# Patient Record
Sex: Female | Born: 1970 | Race: Black or African American | Hispanic: No | Marital: Single | Smoking: Former smoker
Health system: Southern US, Community
[De-identification: ages and names within clinical notes are randomized; demographics above are authoritative.]

## PROBLEM LIST (undated history)

## (undated) HISTORY — PX: BREAST EXCISIONAL BIOPSY: SUR124

---

## 2016-08-17 ENCOUNTER — Encounter: Payer: Self-pay | Admitting: Emergency Medicine

## 2016-08-17 ENCOUNTER — Emergency Department: Payer: 59

## 2016-08-17 ENCOUNTER — Emergency Department
Admission: EM | Admit: 2016-08-17 | Discharge: 2016-08-17 | Disposition: A | Discharge status: Home / Self Care | Payer: 59 | Attending: Emergency Medicine | Admitting: Emergency Medicine

## 2016-08-17 DIAGNOSIS — J069 Acute upper respiratory infection, unspecified: Secondary | ICD-10-CM | POA: Diagnosis not present

## 2016-08-17 DIAGNOSIS — R05 Cough: Secondary | ICD-10-CM | POA: Diagnosis present

## 2016-08-17 DIAGNOSIS — Z87891 Personal history of nicotine dependence: Secondary | ICD-10-CM | POA: Diagnosis not present

## 2016-08-17 MED ORDER — FLUCONAZOLE 150 MG PO TABS: 150.0000 mg | ORAL_TABLET | Freq: Every day | ORAL | 0 refills | Status: DC

## 2016-08-17 MED ORDER — AZITHROMYCIN 250 MG PO TABS: ORAL_TABLET | ORAL | 0 refills | Status: DC

## 2016-08-17 MED ORDER — GUAIFENESIN-CODEINE 100-10 MG/5ML PO SOLN: 10.0000 mL | ORAL | 0 refills | Status: DC | PRN

## 2016-08-17 NOTE — ED Triage Notes (Signed)
Has had nasal/chest congestion since thanksgiving. C/o cough as well but nothing comes up. No known fevers. Pain with cough. No distress noted.

## 2016-08-17 NOTE — ED Provider Notes (Signed)
[facility] Emergency Department Provider Note   ____________________________________________   First MD Initiated Contact with Patient 08/17/16 [phone]     (approximate)  I have reviewed the triage vital signs and the nursing notes.   HISTORY  Chief Complaint Cough    HPI Kristin Villanueva is a 45 y.o. female presents for evaluation of a cough 10 days. Patient states that she still had Tylenol and ibuprofen over-the-counter no relief with over-the-counter cough drops. Patient states that symptoms started on thinks daily and have progressively worsened 7. Patient reports having fever on and off but none today. Took ibuprofen prior to arrival.   History reviewed. No pertinent past medical history.  There are no active problems to display for this patient.   Past Surgical History:  Procedure Laterality Date  . CESAREAN SECTION      Prior to Admission medications   Medication Sig Start Date End Date Taking? Authorizing Provider  azithromycin (ZITHROMAX Z-PAK) 250 MG tablet Take 2 tablets (500 mg) on  Day 1,  followed by 1 tablet (250 mg) once daily on Days 2 through 5. 08/17/16   Evangeline Dakinharles M Bettye Sitton, PA-C  guaiFENesin-codeine 100-10 MG/5ML syrup Take 10 mLs by mouth every 4 (four) hours as needed for cough. 08/17/16   Evangeline Dakinharles M Birgit Nowling, PA-C    Allergies Patient has no known allergies.  History reviewed. No pertinent family history.  Social History Social History  Substance Use Topics  . Smoking status: Former Games developermoker  . Smokeless tobacco: Never Used  . Alcohol use Yes    Review of Systems Constitutional: No fever/chills Eyes: No visual changes. ENT: No sore throat. Cardiovascular: Denies chest pain. Respiratory: Denies shortness of breath.Positive for productive cough with yellow-green sputum. Gastrointestinal: No abdominal pain.  No nausea, no vomiting.  No diarrhea.  No constipation. Genitourinary: Negative for dysuria. Musculoskeletal: Negative  for back pain. Skin: Negative for rash. Neurological: Negative for headaches, focal weakness or numbness.  10-point ROS otherwise negative.  ____________________________________________   PHYSICAL EXAM:  VITAL SIGNS: ED Triage Vitals  Enc Vitals Group     BP 08/17/16 0921 (!) 117/98     Pulse Rate 08/17/16 0921 75     Resp 08/17/16 0921 18     Temp 08/17/16 0921 98.4 F (36.9 C)     Temp Source 08/17/16 0921 Oral     SpO2 08/17/16 0921 99 %     Weight 08/17/16 0922 160 lb (72.6 kg)     Height 08/17/16 0922 5\' 4"  (1.626 m)     Head Circumference --      Peak Flow --      Pain Score 08/17/16 0922 6     Pain Loc --      Pain Edu? --      Excl. in GC? --     Constitutional: Alert and oriented. Well appearing and in no acute distress. Eyes: Conjunctivae are normal. PERRL. EOMI. Head: Atraumatic. Nose: No congestion/rhinnorhea. Mouth/Throat: Mucous membranes are moist.  Oropharynx non-erythematous. Neck: No stridor. Supple, full range of motion nontender.  Cardiovascular: Normal rate, regular rhythm. Grossly normal heart sounds.  Good peripheral circulation. Respiratory: Normal respiratory effort.  No retractions. Lungs CTAB. No rhonchi or rales noted. No wheezing available. Gastrointestinal: Soft and nontender. No distention. No abdominal bruits. No CVA tenderness. Musculoskeletal: No lower extremity tenderness nor edema.  No joint effusions. Neurologic:  Normal speech and language. No gross focal neurologic deficits are appreciated. No gait instability. Skin:  Skin is warm,  dry and intact. No rash noted. Psychiatric: Mood and affect are normal. Speech and behavior are normal.  ____________________________________________   LABS (all labs ordered are listed, but only abnormal results are displayed)  Labs Reviewed - No data to display ____________________________________________  EKG   ____________________________________________  RADIOLOGY  No acute radiological  findings ____________________________________________   PROCEDURES  Procedure(s) performed: None  Procedures  Critical Care performed: No  ____________________________________________   INITIAL IMPRESSION / ASSESSMENT AND PLAN / ED COURSE  Pertinent labs & imaging results that were available during my care of the patient were reviewed by me and considered in my medical decision making (see chart for details).  Acute bronchitis. We'll obtain a chest x-ray to rule out pneumonia.  Clinical Course    Acute bronchitis/URI. Rx given for Zithromax, Robitussin-AC. Continue ibuprofen and over-the-counter medication as needed. Follow up with PCP or return to ER with any worsening symptomology.  ____________________________________________   FINAL CLINICAL IMPRESSION(S) / ED DIAGNOSES  Final diagnoses:  Upper respiratory tract infection, unspecified type      NEW MEDICATIONS STARTED DURING THIS VISIT:  New Prescriptions   AZITHROMYCIN (ZITHROMAX Z-PAK) 250 MG TABLET    Take 2 tablets (500 mg) on  Day 1,  followed by 1 tablet (250 mg) once daily on Days 2 through 5.   GUAIFENESIN-CODEINE 100-10 MG/5ML SYRUP    Take 10 mLs by mouth every 4 (four) hours as needed for cough.     Note:  This document was prepared using Dragon voice recognition software and may include unintentional dictation errors.   Evangeline Dakinharles M Joselynne Killam, PA-C 08/17/16 1103    Emily FilbertJonathan E Williams, MD 08/17/16 [phone]

## 2017-09-24 ENCOUNTER — Emergency Department
Admission: EM | Admit: 2017-09-24 | Discharge: 2017-09-24 | Disposition: A | Discharge status: Home / Self Care | Payer: Managed Care, Other (non HMO) | Source: Home / Self Care | Attending: Emergency Medicine | Admitting: Emergency Medicine

## 2017-09-24 ENCOUNTER — Other Ambulatory Visit: Payer: Self-pay

## 2017-09-24 ENCOUNTER — Encounter: Payer: Self-pay | Admitting: Emergency Medicine

## 2017-09-24 ENCOUNTER — Emergency Department
Admission: EM | Admit: 2017-09-24 | Discharge: 2017-09-24 | Disposition: A | Discharge status: Home / Self Care | Payer: Managed Care, Other (non HMO) | Attending: Student in an Organized Health Care Education/Training Program | Admitting: Student in an Organized Health Care Education/Training Program

## 2017-09-24 DIAGNOSIS — K047 Periapical abscess without sinus: Secondary | ICD-10-CM

## 2017-09-24 DIAGNOSIS — Z87891 Personal history of nicotine dependence: Secondary | ICD-10-CM

## 2017-09-24 DIAGNOSIS — K0889 Other specified disorders of teeth and supporting structures: Secondary | ICD-10-CM | POA: Diagnosis present

## 2017-09-24 MED ORDER — HYDROCODONE-ACETAMINOPHEN 5-325 MG PO TABS: 1.0000 | ORAL_TABLET | Freq: Four times a day (QID) | ORAL | 0 refills | Status: DC | PRN

## 2017-09-24 MED ORDER — AMOXICILLIN 875 MG PO TABS: 875.0000 mg | ORAL_TABLET | Freq: Two times a day (BID) | ORAL | 0 refills | Status: DC

## 2017-09-24 MED ORDER — AMOXICILLIN 500 MG PO CAPS
500.0000 mg | ORAL_CAPSULE | Freq: Once | ORAL | Status: AC
  Administered 2017-09-24: 500 mg via ORAL
  Filled 2017-09-24: qty 1

## 2017-09-24 MED ORDER — ACETAMINOPHEN 325 MG PO TABS
650.0000 mg | ORAL_TABLET | Freq: Once | ORAL | Status: AC
  Administered 2017-09-24: 650 mg via ORAL
  Filled 2017-09-24: qty 2

## 2017-09-24 MED ORDER — BUPIVACAINE HCL (PF) 0.5 % IJ SOLN
20.0000 mL | Freq: Once | INTRAMUSCULAR | Status: AC
  Administered 2017-09-24: 20 mL
  Filled 2017-09-24: qty 30

## 2017-09-24 NOTE — Discharge Instructions (Signed)
Please seek medical attention for any high fevers, chest pain, shortness of breath, change in behavior, persistent vomiting, bloody stool or any other new or concerning symptoms.  

## 2017-09-24 NOTE — Discharge Instructions (Signed)
Follow-up with your regular doctor if you are not better and 3-5 days, you can either call the [facility] or Dr. Renae GlossHogan's office, both phone numbers have been given to you, take medication as prescribed, also take ibuprofen, return to emergency department if you are worsening

## 2017-09-24 NOTE — ED Triage Notes (Signed)
Pt arrived to the ED for complaints of left lower dental pain. Pt is AOx4 in no apparent distress with notable swelling and redness on the affected area.

## 2017-09-24 NOTE — ED Provider Notes (Signed)
[facility] Emergency Department Provider Note  ____________________________________________   I have reviewed the triage vital signs and the nursing notes.   HISTORY  Chief Complaint Dental Abscess   History limited by: Not Limited   HPI Kristin Villanueva is a 47 y.o. female who presents to the emergency department today because of concern for left facial swelling and pain.   LOCATION:left lower jaw DURATION:1 day TIMING: constant, worsening SEVERITY: severe QUALITY: painful, swelling CONTEXT: patient was seen in the emergency department earlier today because of concern for left lower jaw pain. Diagnosed with dental infection at that time. States she was put on antibiotics. Today the pain continued and swelling had developed.  MODIFYING FACTORS: worse with palpation ASSOCIATED SYMPTOMS: denies any fevers. Has had trouble with swallowing.   Per medical record review patient has a history of ED visit earlier today.  History reviewed. No pertinent past medical history.  There are no active problems to display for this patient.   Past Surgical History:  Procedure Laterality Date  . CESAREAN SECTION      Prior to Admission medications   Medication Sig Start Date End Date Taking? Authorizing Provider  amoxicillin (AMOXIL) 875 MG tablet Take 1 tablet (875 mg total) by mouth 2 (two) times daily. 09/24/17   Fisher, Roselyn BeringSusan W, PA-C  HYDROcodone-acetaminophen (NORCO/VICODIN) 5-325 MG tablet Take 1 tablet by mouth every 6 (six) hours as needed for moderate pain. 09/24/17   Faythe GheeFisher, Susan W, PA-C    Allergies Patient has no known allergies.  No family history on file.  Social History Social History   Tobacco Use  . Smoking status: Former Games developermoker  . Smokeless tobacco: Never Used  Substance Use Topics  . Alcohol use: Yes  . Drug use: No    Review of Systems Constitutional: No fever/chills Eyes: No visual changes. ENT: Positive for left lower jaw pain and  swelling. Cardiovascular: Denies chest pain. Respiratory: Denies shortness of breath. Gastrointestinal: No abdominal pain.  No nausea, no vomiting.  No diarrhea.   Genitourinary: Negative for dysuria. Musculoskeletal: Negative for back pain. Skin: Negative for rash. Neurological: Negative for headaches, focal weakness or numbness.  ____________________________________________   PHYSICAL EXAM:  VITAL SIGNS: ED Triage Vitals  Enc Vitals Group     BP 09/24/17 1717 (!) 160/92     Pulse Rate 09/24/17 1717 76     Resp 09/24/17 1717 18     Temp 09/24/17 1717 99.8 F (37.7 C)     Temp Source 09/24/17 1717 Oral     SpO2 09/24/17 1717 98 %     Weight 09/24/17 1718 165 lb (74.8 kg)     Height 09/24/17 1718 5\' 4"  (1.626 m)     Head Circumference --      Peak Flow --      Pain Score 09/24/17 1717 10    Constitutional: Alert and oriented. Well appearing and in no distress. Eyes: Conjunctivae are normal.  ENT   Head: Normocephalic and atraumatic.   Nose: No congestion/rhinnorhea.   Mouth/Throat: Poor dentition of the left lower jaw. Tender to palpation. Some bleeding at site of decaying tooth. Swelling to cheek around the area.    Neck: No stridor. Hematological/Lymphatic/Immunilogical: No cervical lymphadenopathy. Cardiovascular: Normal rate, regular rhythm.  No murmurs, rubs, or gallops.  Respiratory: Normal respiratory effort without tachypnea nor retractions. Breath sounds are clear and equal bilaterally. No wheezes/rales/rhonchi. Gastrointestinal: Soft and non tender. No rebound. No guarding.  Genitourinary: Deferred Musculoskeletal: Normal range of motion  in all extremities. No lower extremity edema. Neurologic:  Normal speech and language. No gross focal neurologic deficits are appreciated.  Skin:  Skin is warm, dry and intact. No rash noted. Psychiatric: Mood and affect are normal. Speech and behavior are normal. Patient exhibits appropriate insight and  judgment.  ____________________________________________    LABS (pertinent positives/negatives)  None  ____________________________________________   EKG  None  ____________________________________________    RADIOLOGY  None   ____________________________________________   PROCEDURES  Procedures  NERVE BLOCK Performed by: Phineas Semen Consent: Verbal consent obtained.  Indication: dental pain Nerve block body site: left lower periodontal block  Needle gauge: 25G Location technique: anatomical landmarks  Local anesthetic: 0.5 bupivicaine  Anesthetic total: 2 ml  Outcome: pain improved Patient tolerance: Patient tolerated the procedure well with no immediate complications.   ____________________________________________   INITIAL IMPRESSION / ASSESSMENT AND PLAN / ED COURSE  Pertinent labs & imaging results that were available during my care of the patient were reviewed by me and considered in my medical decision making (see chart for details).  Patient presents with worsening pain and swelling to left lower jaw. Exam is consistent with dental infection/abscess. Did perform dental block which did help alleviate the patient's pain. Discussed with patient importance of continuing antibiotics and follow up with dentist. No airway compromise to suggest deeper abscess.   ____________________________________________   FINAL CLINICAL IMPRESSION(S) / ED DIAGNOSES  Final diagnoses:  Dental abscess     Note: This dictation was prepared with Dragon dictation. Any transcriptional errors that result from this process are unintentional     Phineas Semen, MD 09/24/17 2047

## 2017-09-24 NOTE — ED Notes (Signed)
See triage note  States she thinks she may have an abscess tooth  Increased pain to left lower gum line   Developed swelling to left jaw last pm

## 2017-09-24 NOTE — ED Notes (Signed)
Pt was seen this am in [facility] for dental abscess to left lower jaw - she was given atb and states that the swelling has increased today - c/o difficulty swallowing "on that side"

## 2017-09-24 NOTE — ED Triage Notes (Addendum)
Pt reports seen here this morning for dental abscess. Pt reports swelling and pain have increased. Pt presents with noticeable swelling to lower right jaw. Pt reports received a dose of antibiotics here this morning and took prescribed one this afternoon.

## 2017-09-24 NOTE — ED Notes (Signed)
Dr. Goodman at bedside.  

## 2017-09-24 NOTE — ED Provider Notes (Signed)
[facility] Emergency Department Provider Note  ____________________________________________   First MD Initiated Contact with Patient 09/24/17 0719     (approximate)  I have reviewed the triage vital signs and the nursing notes.   HISTORY  Chief Complaint Dental Pain    HPI Kristin Villanueva is a 47 y.o. female complains of left-sided jaw pain, states she has a bad tooth in the jaws become more swollen overnight, she states is very painful, she has a fear of dentist, she denies fever chills, chest pain or shortness of breath   History reviewed. No pertinent past medical history.  There are no active problems to display for this patient.   Past Surgical History:  Procedure Laterality Date  . CESAREAN SECTION      Prior to Admission medications   Medication Sig Start Date End Date Taking? Authorizing Provider  amoxicillin (AMOXIL) 875 MG tablet Take 1 tablet (875 mg total) by mouth 2 (two) times daily. 09/24/17   Shakil Dirk, Roselyn Bering, PA-C  HYDROcodone-acetaminophen (NORCO/VICODIN) 5-325 MG tablet Take 1 tablet by mouth every 6 (six) hours as needed for moderate pain. 09/24/17   Faythe Ghee, PA-C    Allergies Patient has no known allergies.  History reviewed. No pertinent family history.  Social History Social History   Tobacco Use  . Smoking status: Former Games developer  . Smokeless tobacco: Never Used  Substance Use Topics  . Alcohol use: Yes  . Drug use: No    Review of Systems  Constitutional: No fever/chills Eyes: No visual changes. ENT: No sore throat.  Positive for tooth pain and jaw swelling Respiratory: Denies cough Genitourinary: Negative for dysuria. Musculoskeletal: Negative for back pain. Skin: Negative for rash.    ____________________________________________   PHYSICAL EXAM:  VITAL SIGNS: ED Triage Vitals [09/24/17 0608]  Enc Vitals Group     BP (!) 154/89     Pulse Rate 84     Resp 16     Temp 98.5 F (36.9 C)   Temp Source Oral     SpO2 100 %     Weight 165 lb (74.8 kg)     Height 5\' 4"  (1.626 m)     Head Circumference      Peak Flow      Pain Score 9     Pain Loc      Pain Edu?      Excl. in GC?     Constitutional: Alert and oriented. Well appearing and in no acute distress. Eyes: Conjunctivae are normal.  Head: Atraumatic. Nose: No congestion/rhinnorhea. Mouth/Throat: Mucous membranes are moist.  Left lower molar is grossly decayed, the gum is swollen, the left jaw is swollen and tender, there is no cervical lymphadenopathy noted Cardiovascular: Normal rate, regular rhythm.  Heart sounds are normal Respiratory: Normal respiratory effort.  No retractions, lungs clear to auscultation GU: deferred Musculoskeletal: FROM all extremities, warm and well perfused Neurologic:  Normal speech and language.  Skin:  Skin is warm, dry and intact. No rash noted. Psychiatric: Mood and affect are normal. Speech and behavior are normal.  ____________________________________________   LABS (all labs ordered are listed, but only abnormal results are displayed)  Labs Reviewed - No data to display ____________________________________________   ____________________________________________  RADIOLOGY    ____________________________________________   PROCEDURES  Procedure(s) performed: No      ____________________________________________   INITIAL IMPRESSION / ASSESSMENT AND PLAN / ED COURSE  Pertinent labs & imaging results that were available during my care of  the patient were reviewed by me and considered in my medical decision making (see chart for details).  Patient is 47 year old female complaining o20f left-sided jaw pain due to a dental abscess, physical exam shows swelling to the left jaw and in grossly decayed left lower molar  Diagnosis is dental abscess, prescription for amoxicillin 875 twice daily for 10 days, Norco 5/325 #10 no refill, and she is to continue taking  ibuprofen, she was given phone numbers for dental clinics, she is to follow-up with 1 of them, she is to return to the emergency department if she is worsening     As part of my medical decision making, I reviewed the following data within the electronic MEDICAL RECORD NUMBERCorey is  ____________________________________________   FINAL CLINICAL IMPRESSION(S) / ED DIAGNOSES  Final diagnoses:  Dental abscess      NEW MEDICATIONS STARTED DURING THIS VISIT:  This SmartLink is deprecated. Use AVSMEDLIST instead to display the medication list for a patient.   Note:  This document was prepared using Dragon voice recognition software and may include unintentional dictation errors.    Faythe GheeFisher, Hikeem Andersson W, PA-C 09/24/17 0749    Willy Eddyobinson, Patrick, MD 09/24/17 [phone]

## 2018-03-16 ENCOUNTER — Emergency Department
Admission: EM | Admit: 2018-03-16 | Discharge: 2018-03-16 | Disposition: A | Discharge status: Home / Self Care | Payer: Managed Care, Other (non HMO) | Attending: Emergency Medicine | Admitting: Emergency Medicine

## 2018-03-16 ENCOUNTER — Other Ambulatory Visit: Payer: Self-pay

## 2018-03-16 DIAGNOSIS — Z87891 Personal history of nicotine dependence: Secondary | ICD-10-CM | POA: Diagnosis not present

## 2018-03-16 DIAGNOSIS — K047 Periapical abscess without sinus: Secondary | ICD-10-CM

## 2018-03-16 DIAGNOSIS — K0889 Other specified disorders of teeth and supporting structures: Secondary | ICD-10-CM | POA: Diagnosis present

## 2018-03-16 MED ORDER — LIDOCAINE VISCOUS HCL 2 % MT SOLN: 10.0000 mL | OROMUCOSAL | 0 refills | Status: DC | PRN

## 2018-03-16 MED ORDER — IBUPROFEN 600 MG PO TABS: 600.0000 mg | ORAL_TABLET | Freq: Four times a day (QID) | ORAL | 0 refills | Status: DC | PRN

## 2018-03-16 MED ORDER — AMOXICILLIN 500 MG PO CAPS: 500.0000 mg | ORAL_CAPSULE | Freq: Three times a day (TID) | ORAL | 0 refills | Status: DC

## 2018-03-16 NOTE — ED Provider Notes (Signed)
[facility] Emergency Department Provider Note  ____________________________________________  Time seen: Approximately 7:37 AM  I have reviewed the triage vital signs and the nursing notes.   HISTORY  Chief Complaint Dental Pain and Facial Swelling    HPI Kristin Villanueva is a 47 y.o. female that presents to the emergency department for evaluation of left upper dental pain for 2 days.  Patient  left cheek swelling this morning.  She has not seen a dentist in over a year.  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  She notes that she has many cavities that need to be fixed.  She took 600 mg of ibuprofen this morning with minimal relief.  No fever, chills, difficulty opening or closing mouth, difficulty swallowing, nausea, vomiting.   History reviewed. No pertinent past medical history.  There are no active problems to display for this patient.   Past Surgical History:  Procedure Laterality Date  . CESAREAN SECTION      Prior to Admission medications   Medication Sig Start Date End Date Taking? Authorizing Provider  amoxicillin (AMOXIL) 500 MG capsule Take 1 capsule (500 mg total) by mouth 3 (three) times daily. 03/16/18   Enid Derry, PA-C  HYDROcodone-acetaminophen (NORCO/VICODIN) 5-325 MG tablet Take 1 tablet by mouth every 6 (six) hours as needed for moderate pain. 09/24/17   Fisher, Roselyn Bering, PA-C  ibuprofen (ADVIL,MOTRIN) 600 MG tablet Take 1 tablet (600 mg total) by mouth every 6 (six) hours as needed. 03/16/18   Enid Derry, PA-C  lidocaine (XYLOCAINE) 2 % solution Use as directed 10 mLs in the mouth or throat as needed for mouth pain. 03/16/18   Enid Derry, PA-C    Allergies Patient has no known allergies.  No family history on file.  Social History Social History   Tobacco Use  . Smoking status: Former Games developer  . Smokeless tobacco: Never Used  Substance Use Topics  . Alcohol use: Yes  . Drug use: No     Review of  Systems  Constitutional: No fever/chills Respiratory: No SOB. Gastrointestinal: No nausea, no vomiting.  Musculoskeletal: Negative for musculoskeletal pain. Skin: Negative for rash, abrasions, lacerations, ecchymosis. Neurological: Negative for headaches   ____________________________________________   PHYSICAL EXAM:  VITAL SIGNS: ED Triage Vitals [03/16/18 0717]  Enc Vitals Group     BP 137/74     Pulse Rate 78     Resp 14     Temp 98.7 F (37.1 C)     Temp Source Oral     SpO2 100 %     Weight 160 lb (72.6 kg)     Height 5\' 4"  (1.626 m)     Head Circumference      Peak Flow      Pain Score 7     Pain Loc      Pain Edu?      Excl. in GC?      Constitutional: Alert and oriented. Well appearing and in no acute distress. Eyes: Conjunctivae are normal. PERRL. EOMI. Head: Atraumatic. ENT:      Ears:      Nose: No congestion/rhinnorhea.      Mouth/Throat: Mucous membranes are moist.  Top left canine decayed to gumline.  Tenderness to palpation around tooth.  Moderate diffuse swelling to left cheek. No submandibular swelling. No palpable abscess. No difficulty opening or closing mouth. Neck: No stridor.   Cardiovascular: Good peripheral circulation. Respiratory: Normal respiratory effort without tachypnea or retractions.  Musculoskeletal: Full range of motion to all extremities. No gross deformities appreciated. Neurologic:  Normal speech and language. No gross focal neurologic deficits are appreciated.  Skin:  Skin is warm, dry and intact. No rash noted. Psychiatric: Mood and affect are normal. Speech and behavior are normal. Patient exhibits appropriate insight and judgement.   ____________________________________________   LABS (all labs ordered are listed, but only abnormal results are displayed)  Labs Reviewed - No data to display ____________________________________________  EKG   ____________________________________________  RADIOLOGY   No results  found.  ____________________________________________    PROCEDURES  Procedure(s) performed:    Procedures    Medications - No data to display   ____________________________________________   INITIAL IMPRESSION / ASSESSMENT AND PLAN / ED COURSE  Pertinent labs & imaging results that were available during my care of the patient were reviewed by me and considered in my medical decision making (see chart for details).  Review of the [employer] CSRS was performed in accordance of the [employer] prior to dispensing any controlled drugs.     Patient's diagnosis is consistent with dental abscess.  Vital signs and exam are reassuring.  Dental resources were given.  Patient will be discharged home with prescriptions for amoxicillin, viscous lidocaine, ibuprofen. Patient is to follow up with PCP as directed. Patient is given ED precautions to return to the ED for any worsening or new symptoms.     ____________________________________________  FINAL CLINICAL IMPRESSION(S) / ED DIAGNOSES  Final diagnoses:  Dental abscess      NEW MEDICATIONS STARTED DURING THIS VISIT:  ED Discharge Orders        Ordered    amoxicillin (AMOXIL) 500 MG capsule  3 times daily     03/16/18 0744    ibuprofen (ADVIL,MOTRIN) 600 MG tablet  Every 6 hours PRN     03/16/18 0744    lidocaine (XYLOCAINE) 2 % solution  As needed     03/16/18 0744          This chart was dictated using voice recognition software/Dragon. Despite best efforts to proofread, errors can occur which can change the meaning. Any change was purely unintentional.    Enid DerryWagner, Kaimana Lurz, PA-C 03/16/18 1335    Darci CurrentBrown, Rigby N, MD 03/19/18 [phone]

## 2018-03-16 NOTE — Discharge Instructions (Signed)
OPTIONS FOR DENTAL FOLLOW UP CARE ° °[place] Department of Health and Human Services - Local Safety Net Dental Clinics °http://www.ncdhhs.gov/dph/oralhealth/services/safetynetclinics.htm °  °[facility] (336-562-3123) ° °[facility] [place] ([phone]) ° °[facility] ([phone] ext [phone]) ° ° [facility] ([phone]) ° °[facility] ([phone]) °This clinic caters to the indigent population and is on a lottery system. °Location: °[facility], [address], [place] °Clinic Hours: °Wednesdays from 6pm - 9pm, patients seen by a lottery system. °For dates, call or go to [email or web address].[facility].edu/[facility]/patients/Dental-[facility] °Services: °Cleanings, fillings and simple extractions. °Payment Options: °DENTAL WORK IS FREE OF CHARGE. Bring proof of income or support. °Best way to get seen: °Arrive at 5:15 pm - this is a lottery, NOT first come/first serve, so arriving earlier will not increase your chances of being seen. °  °  °[facility] °[phone] °Select option 1 for emergencies °  °Location: °[facility], [address], [place] °Clinic Hours: °No walk-ins accepted - call the day before to schedule an appointment. °Check in times are 9:30 am and 1:30 pm. °Services: °Simple extractions, temporary fillings, pulpectomy/pulp debridement, uncomplicated abscess drainage. °Payment Options: °PAYMENT IS DUE AT THE TIME OF SERVICE.  Fee is usually $100-200, additional surgical procedures (e.g. abscess drainage) may be extra. °Cash, checks, Visa/MasterCard accepted.  Can file Medicaid if patient is covered for dental - patient should call case worker to check. °No discount for [facility] patients. °Best way to get seen: °MUST call the day before and get onto the schedule. Can usually be seen the next 1-2 days. No walk-ins accepted. °  °  °[facility] °[phone] °   °Location: °[facility], [address], [place] °Clinic Hours: °M, W, Th, F 8am or 1:30pm, Tues 9a or 1:30 - first come/first served. °Services: °Simple extractions, temporary fillings, uncomplicated abscess drainage.  You do not need to be an [place] resident. °Payment Options: °PAYMENT IS DUE AT THE TIME OF SERVICE. °Dental insurance, otherwise sliding scale - bring proof of income or support. °Depending on income and treatment needed, cost is usually $50-200. °Best way to get seen: °Arrive early as it is first come/first served. °  °  °[facility] °[phone] °  °Location: °[address] °Clinic Hours: °Mon-Thu 8a-5p °Services: °Most basic dental services including extractions and fillings. °Payment Options: °PAYMENT IS DUE AT THE TIME OF SERVICE. °Sliding scale, up to 50% off - bring proof if income or support. °Medicaid with dental option accepted. °Best way to get seen: °Call to schedule an appointment, can usually be seen within 2 weeks OR they will try to see walk-ins - show up at 8a or 2p (you may have to wait). °  °  °[facility] °[phone] °[place] RESIDENTS ONLY °  °Location: °[facility], [address], [place],  [address] °Clinic Hours: By appointment only. °Monday - Thursday 8am-5pm, Friday 8am-12pm °Services: Cleanings, fillings, extractions. °Payment Options: °PAYMENT IS DUE AT THE TIME OF SERVICE. °Cash, Visa or MasterCard. Sliding scale - $30 minimum per service. °Best way to get seen: °Come in to office, complete packet and make an appointment - need proof of income °or support monies for each household member and proof of [place] residence. °Usually takes about a month to get in. °  °  °[facility] °[phone] °  °Location: °[address]   [place] °Clinic Hours: Walk-in Urgent Care Dental Services are offered Monday-Friday  mornings only. °The numbers of emergencies accepted daily is limited to the number of °providers available. °Maximum 15 - Mondays, Wednesdays & Thursdays °Maximum 10 - Tuesdays & Fridays °Services: °You do not need to be a [place] resident to be seen for a dental emergency. °Emergencies are defined as pain, swelling, abnormal bleeding, or dental trauma. Walkins will receive x-rays if needed. °NOTE: Dental cleaning is not an emergency. °Payment Options: °PAYMENT IS DUE AT THE TIME OF SERVICE. °Minimum co-pay is $40.00 for uninsured patients. °Minimum co-pay is $3.00 for Medicaid with dental coverage. °Dental Insurance is accepted and must be presented at time of visit. °Medicare does not cover dental. °Forms of payment: Cash, credit card, checks. °Best way to get seen: °If not previously registered with the clinic, walk-in dental registration begins at 7:15 am and is on a first come/first serve basis. °If previously registered with the clinic, call to make an appointment. °  °  °[facility] °[phone] °[place] RESIDENTS ONLY °  °Location: °[address], [place], [place] °Clinic Hours: °Mon-Thu 10a-2p °Services: Extractions only! °Payment Options: °FREE (donations accepted) - bring proof of income or support °Best way to get seen: °Call and schedule an appointment OR come at 8am on the 1st Monday of every month (except for holidays) when it is first come/first served. °  °  °[facility] °[phone] °  °Location: °[address], [place] °Clinic Hours: °Friday mornings °Services, Payment Options, Best way to get seen: °Call for info °

## 2018-03-16 NOTE — ED Triage Notes (Signed)
Left upper toothache and left sided facial swelling that began yesterday. Pt alert and oriented X4, active, cooperative, pt in NAD. RR even and unlabored, color WNL.

## 2018-03-16 NOTE — ED Notes (Signed)
See triage note  Presents with pain and swelling to left side of face  States she has a bad tooth to upper gumline  Woke up this am with facial swelling

## 2019-12-03 ENCOUNTER — Encounter: Payer: Self-pay | Admitting: Emergency Medicine

## 2019-12-03 ENCOUNTER — Other Ambulatory Visit: Payer: Self-pay

## 2019-12-03 ENCOUNTER — Emergency Department
Admission: EM | Admit: 2019-12-03 | Discharge: 2019-12-03 | Disposition: A | Discharge status: Home / Self Care | Payer: BLUE CROSS/BLUE SHIELD | Attending: Emergency Medicine | Admitting: Emergency Medicine

## 2019-12-03 DIAGNOSIS — K047 Periapical abscess without sinus: Secondary | ICD-10-CM | POA: Diagnosis not present

## 2019-12-03 DIAGNOSIS — K0889 Other specified disorders of teeth and supporting structures: Secondary | ICD-10-CM | POA: Diagnosis present

## 2019-12-03 MED ORDER — AMOXICILLIN 875 MG PO TABS: 875.0000 mg | ORAL_TABLET | Freq: Two times a day (BID) | ORAL | 0 refills | Status: DC

## 2019-12-03 MED ORDER — TRAMADOL HCL 50 MG PO TABS: 50.0000 mg | ORAL_TABLET | Freq: Four times a day (QID) | ORAL | 0 refills | Status: AC | PRN

## 2019-12-03 NOTE — ED Triage Notes (Signed)
Pt states increasing pain and swelling of right lower tooth.  States dental appointment next week.

## 2019-12-03 NOTE — ED Provider Notes (Signed)
[facility] Emergency Department Provider Note  ____________________________________________   First MD Initiated Contact with Patient 12/03/19 1337     (approximate)  I have reviewed the triage vital signs and the nursing notes.   HISTORY  Chief Complaint Dental Pain   HPI Kristin Villanueva is a 49 y.o. female resents to the ED with complaint of dental pain.  Patient states that she has an appointment next Thursday with a dentist but last evening her gum began swelling and this morning is more painful.  Patient denies any fever or chills.  Her pain is 7 out of 10.      History reviewed. No pertinent past medical history.  There are no problems to display for this patient.   Past Surgical History:  Procedure Laterality Date  . CESAREAN SECTION      Prior to Admission medications   Medication Sig Start Date End Date Taking? Authorizing Provider  amoxicillin (AMOXIL) 875 MG tablet Take 1 tablet (875 mg total) by mouth 2 (two) times daily. 12/03/19   Tommi Rumps, PA-C  traMADol (ULTRAM) 50 MG tablet Take 1 tablet (50 mg total) by mouth every 6 (six) hours as needed. 12/03/19   Tommi Rumps, PA-C    Allergies Patient has no known allergies.  History reviewed. No pertinent family history.  Social History Social History   Tobacco Use  . Smoking status: Former Games developer  . Smokeless tobacco: Never Used  Substance Use Topics  . Alcohol use: Yes  . Drug use: No    Review of Systems Constitutional: No fever/chills Eyes: No visual changes. ENT: No sore throat.  Positive dental pain. Cardiovascular: Denies chest pain. Respiratory: Denies shortness of breath.  Negative for cough. Gastrointestinal:   No nausea, no vomiting.  Musculoskeletal: Negative for back pain. Skin: Negative for rash. Neurological: Negative for headaches, focal weakness or numbness. ____________________________________________   PHYSICAL EXAM:  VITAL SIGNS: ED  Triage Vitals  Enc Vitals Group     BP 12/03/19 1328 (!) 176/99     Pulse Rate 12/03/19 1328 75     Resp 12/03/19 1328 18     Temp 12/03/19 1328 98.8 F (37.1 C)     Temp Source 12/03/19 1328 Oral     SpO2 12/03/19 1328 100 %     Weight 12/03/19 1329 165 lb (74.8 kg)     Height 12/03/19 1329 5\' 4"  (1.626 m)     Head Circumference --      Peak Flow --      Pain Score 12/03/19 1329 7     Pain Loc --      Pain Edu? --      Excl. in GC? --    Constitutional: Alert and oriented. Well appearing and in no acute distress. Eyes: Conjunctivae are normal.  Head: Atraumatic. Mouth/Throat: Mucous membranes are moist.  Oropharynx non-erythematous.  Left lower premolar molars are in very poor repair and hygiene.  Gums are swollen around the premolars but no active drainage is noted.  Large cavities are noted. Neck: No stridor.   Hematological/Lymphatic/Immunilogical: No cervical lymphadenopathy. Cardiovascular: Normal rate, regular rhythm. Grossly normal heart sounds.  Good peripheral circulation. Respiratory: Normal respiratory effort.  No retractions. Lungs CTAB. Musculoskeletal: Moves upper and lower extremities with any difficulty.  Normal gait was noted. Neurologic:  Normal speech and language. No gross focal neurologic deficits are appreciated. No gait instability. Skin:  Skin is warm, dry and intact. No rash noted. Psychiatric: Mood and affect  are normal. Speech and behavior are normal.  ____________________________________________   LABS (all labs ordered are listed, but only abnormal results are displayed)  Labs Reviewed - No data to display  PROCEDURES  Procedure(s) performed (including Critical Care):  Procedures   ____________________________________________   INITIAL IMPRESSION / ASSESSMENT AND PLAN / ED COURSE  As part of my medical decision making, I reviewed the following data within the electronic MEDICAL RECORD NUMBER Notes from prior ED visits and [place] Controlled  Substance Database  49 year old female presents to the ED with complaint of dental abscess.  Patient states that she has an appointment next week with her dentist.  Area began swelling last evening and increased in pain and swelling this morning.  Patient was given a prescription for amoxicillin 875 twice daily.  Patient was also given a small amount of tramadol to take every 6 hours as needed for severe pain.  She is also encouraged to keep her dental appointment next week.  ____________________________________________   FINAL CLINICAL IMPRESSION(S) / ED DIAGNOSES  Final diagnoses:  Dental abscess     ED Discharge Orders         Ordered    amoxicillin (AMOXIL) 875 MG tablet  2 times daily     12/03/19 1405    traMADol (ULTRAM) 50 MG tablet  Every 6 hours PRN     12/03/19 1405           Note:  This document was prepared using Dragon voice recognition software and may include unintentional dictation errors.    Johnn Hai, PA-C 12/03/19 1449    Harvest Dark, MD 12/03/19 1511

## 2019-12-03 NOTE — ED Notes (Signed)
See triage note  Presents with possible dental abscess  States she has an appt  Next week  But now is having more swelling

## 2019-12-03 NOTE — Discharge Instructions (Signed)
Keep your appointment with the dentist next week.  Begin taking amoxicillin 875 twice daily until finished.  Tramadol was sent to the pharmacy as well to be taken every 6 hours as needed for pain.  You may also take Tylenol or ibuprofen with this medication if additional pain medication is needed

## 2020-04-15 ENCOUNTER — Other Ambulatory Visit: Payer: Self-pay

## 2020-04-15 ENCOUNTER — Emergency Department
Admission: EM | Admit: 2020-04-15 | Discharge: 2020-04-15 | Disposition: A | Discharge status: Home / Self Care | Payer: BLUE CROSS/BLUE SHIELD | Attending: Emergency Medicine | Admitting: Emergency Medicine

## 2020-04-15 DIAGNOSIS — Z87891 Personal history of nicotine dependence: Secondary | ICD-10-CM | POA: Diagnosis not present

## 2020-04-15 DIAGNOSIS — K0889 Other specified disorders of teeth and supporting structures: Secondary | ICD-10-CM | POA: Diagnosis present

## 2020-04-15 DIAGNOSIS — K047 Periapical abscess without sinus: Secondary | ICD-10-CM | POA: Insufficient documentation

## 2020-04-15 MED ORDER — AMOXICILLIN-POT CLAVULANATE 875-125 MG PO TABS
1.0000 | ORAL_TABLET | Freq: Once | ORAL | Status: AC
  Administered 2020-04-15: 1 via ORAL
  Filled 2020-04-15: qty 1

## 2020-04-15 MED ORDER — HYDROCODONE-ACETAMINOPHEN 5-325 MG PO TABS: 1.0000 | ORAL_TABLET | Freq: Four times a day (QID) | ORAL | 0 refills | Status: AC | PRN

## 2020-04-15 MED ORDER — AMOXICILLIN-POT CLAVULANATE 875-125 MG PO TABS: 1.0000 | ORAL_TABLET | Freq: Two times a day (BID) | ORAL | 0 refills | Status: AC

## 2020-04-15 NOTE — ED Provider Notes (Signed)
[facility] Emergency Department Provider Note ____________________________________________  Time seen: Approximately 9:55 PM  I have reviewed the triage vital signs and the nursing notes.   HISTORY  Chief Complaint Dental Pain   HPI Kristin Villanueva is a 49 y.o. female who presents to the emergency department for treatment and evaluation of dental pain.   Pain started several days ago and have progressively worsened.  She noticed that she had some facial swelling that started today.  She is already scheduled an appointment with her dentist for next Thursday but believes that she will need some antibiotics before they will be able to do anything.  She denies fever.  She has been taking ibuprofen with some relief.  No past medical history on file.  There are no problems to display for this patient.   Past Surgical History:  Procedure Laterality Date  . CESAREAN SECTION      Prior to Admission medications   Medication Sig Start Date End Date Taking? Authorizing Provider  amoxicillin (AMOXIL) 875 MG tablet Take 1 tablet (875 mg total) by mouth 2 (two) times daily. 12/03/19   Tommi Rumps, PA-C  amoxicillin-clavulanate (AUGMENTIN) 875-125 MG tablet Take 1 tablet by mouth 2 (two) times daily. 04/15/20   Caryl Fate, Rulon Eisenmenger B, FNP  HYDROcodone-acetaminophen (NORCO/VICODIN) 5-325 MG tablet Take 1 tablet by mouth every 6 (six) hours as needed for up to 3 days for severe pain. 04/15/20 04/18/20  Faustino Luecke, Kasandra Knudsen, FNP  traMADol (ULTRAM) 50 MG tablet Take 1 tablet (50 mg total) by mouth every 6 (six) hours as needed. 12/03/19   Tommi Rumps, PA-C    Allergies Patient has no known allergies.  No family history on file.  Social History Social History   Tobacco Use  . Smoking status: Former Games developer  . Smokeless tobacco: Never Used  Substance Use Topics  . Alcohol use: Yes  . Drug use: No    Review of Systems Constitutional: Negative for fever or recent  illness. ENT: Positive for dental pain. Musculoskeletal: Negative for trismus of the jaw.  Skin: Negative for wound or lesion. ____________________________________________   PHYSICAL EXAM:  VITAL SIGNS: ED Triage Vitals  Enc Vitals Group     BP 04/15/20 1958 (!) 172/103     Pulse Rate 04/15/20 1958 72     Resp 04/15/20 1958 16     Temp 04/15/20 1958 99.2 F (37.3 C)     Temp Source 04/15/20 1958 Oral     SpO2 04/15/20 1958 99 %     Weight 04/15/20 1956 165 lb (74.8 kg)     Height 04/15/20 1956 5\' 4"  (1.626 m)     Head Circumference --      Peak Flow --      Pain Score 04/15/20 2131 9     Pain Loc --      Pain Edu? --      Excl. in GC? --     Constitutional: Alert and oriented. Well appearing and in no acute distress. Eyes: Conjunctiva are clear without discharge or drainage. Mouth/Throat: Airway is patent.  Sublingual surface is soft. Periodontal Exam    Hematological/Lymphatic/Immunilogical: No palpable cervical adenopathy. Respiratory: Respirations even and unlabored. Musculoskeletal: Full ROM of the jaw. Neurologic: Awake, alert, oriented.  Skin: Facial swelling associated with area of dental pain. Psychiatric: Affect and behavior intact.  ____________________________________________   LABS (all labs ordered are listed, but only abnormal results are displayed)  Labs Reviewed - No data to display ____________________________________________  RADIOLOGY  Not indicated. ____________________________________________   PROCEDURES  Procedure(s) performed:   Procedures  Critical Care performed: No ____________________________________________   INITIAL IMPRESSION / ASSESSMENT AND PLAN / ED COURSE  [name] is a 49 y.o. female presenting to the emergency department for treatment and evaluation of dental pain and facial swelling.  See HPI for further details.  Exam reveals concern for dental abscess.  She will be started on Augmentin and advised to  keep her appointment with the dentist.  She will also be given a few days of pain medication.  If symptoms change or worsen and she is unable to see her dentist she is to either see primary care or return to the emergency department.  Pertinent labs & imaging results that were available during my care of the patient were reviewed by me and considered in my medical decision making (see chart for details).  ____________________________________________   FINAL CLINICAL IMPRESSION(S) / ED DIAGNOSES  Final diagnoses:  Dental abscess    New Prescriptions   AMOXICILLIN-CLAVULANATE (AUGMENTIN) 875-125 MG TABLET    Take 1 tablet by mouth 2 (two) times daily.   HYDROCODONE-ACETAMINOPHEN (NORCO/VICODIN) 5-325 MG TABLET    Take 1 tablet by mouth every 6 (six) hours as needed for up to 3 days for severe pain.    If controlled substance prescribed during this visit, 12 month history viewed on the [place] prior to issuing an initial prescription for Schedule II or III opiod.  Note:  This document was prepared using Dragon voice recognition software and may include unintentional dictation errors.   Chinita Pester, FNP 04/15/20 9509    Phineas Semen, MD 04/15/20 2239

## 2020-04-15 NOTE — ED Triage Notes (Signed)
Pt to ED with left sided dental pain and swelling. Pt has a tooth she was going to the dentist for next week but reports she does not feel as though she can make it that long. No fevers.

## 2020-04-15 NOTE — Discharge Instructions (Signed)
Rinse with warm salt water 4 times per day.  Take the antibiotic as prescribed.  Do not take pain medicine if you are going to be driving or operating any type of machinery.

## 2020-04-15 NOTE — ED Notes (Signed)
Pt reports abscess to left side of mouth, has hx of the same about 6 months ago, has appt with dentist on Thursday.

## 2020-08-21 ENCOUNTER — Other Ambulatory Visit: Payer: Self-pay | Admitting: Certified Nurse Midwife

## 2020-08-21 DIAGNOSIS — Z1231 Encounter for screening mammogram for malignant neoplasm of breast: Secondary | ICD-10-CM

## 2020-08-22 ENCOUNTER — Other Ambulatory Visit: Payer: Self-pay

## 2020-08-22 ENCOUNTER — Ambulatory Visit
Admission: RE | Admit: 2020-08-22 | Discharge: 2020-08-22 | Disposition: A | Discharge status: Home / Self Care | Payer: BLUE CROSS/BLUE SHIELD | Source: Ambulatory Visit | Attending: Certified Nurse Midwife | Admitting: Certified Nurse Midwife

## 2020-08-22 DIAGNOSIS — Z1231 Encounter for screening mammogram for malignant neoplasm of breast: Secondary | ICD-10-CM | POA: Diagnosis not present

## 2020-12-02 ENCOUNTER — Encounter: Payer: Self-pay | Admitting: Emergency Medicine

## 2020-12-02 ENCOUNTER — Emergency Department
Admission: EM | Admit: 2020-12-02 | Discharge: 2020-12-02 | Disposition: A | Discharge status: Home / Self Care | Payer: BLUE CROSS/BLUE SHIELD | Attending: Emergency Medicine | Admitting: Emergency Medicine

## 2020-12-02 ENCOUNTER — Other Ambulatory Visit: Payer: Self-pay

## 2020-12-02 DIAGNOSIS — Z87891 Personal history of nicotine dependence: Secondary | ICD-10-CM | POA: Diagnosis not present

## 2020-12-02 DIAGNOSIS — K0889 Other specified disorders of teeth and supporting structures: Secondary | ICD-10-CM | POA: Diagnosis present

## 2020-12-02 DIAGNOSIS — R220 Localized swelling, mass and lump, head: Secondary | ICD-10-CM | POA: Insufficient documentation

## 2020-12-02 MED ORDER — AMOXICILLIN 500 MG PO CAPS
500.0000 mg | ORAL_CAPSULE | Freq: Once | ORAL | Status: AC
  Administered 2020-12-02: 500 mg via ORAL
  Filled 2020-12-02: qty 1

## 2020-12-02 MED ORDER — AMOXICILLIN 875 MG PO TABS: 875.0000 mg | ORAL_TABLET | Freq: Two times a day (BID) | ORAL | 20 refills | Status: AC

## 2020-12-02 NOTE — ED Triage Notes (Signed)
Pt reports toothache to left lower jaw since yesterday.

## 2020-12-02 NOTE — Discharge Instructions (Signed)
Take amoxicillin twice a day for 10 days. Please pick up probiotic at local pharmacy. Continue taking Tylenol and ibuprofen alternating for pain.

## 2020-12-02 NOTE — ED Provider Notes (Signed)
[facility]-EMERGENCY DEPARTMENT  ____________________________________________  Time seen: Approximately 7:54 PM  I have reviewed the triage vital signs and the nursing notes.   HISTORY  Chief Complaint Dental Pain   Historian Patient    HPI Kristin Villanueva is a 50 y.o. female presents to the emergency department with  a broken superior ten and swelling along the left upper jaw that started yesterday.  Patient states that she has an appointment with a local dentist later in the month but states that she is concerned about acute swelling.  She denies fever and chills.  No dysphagia or pain underneath the tongue.  No other alleviating measures have been attempted.   History reviewed. No pertinent past medical history.   Immunizations up to date:  Yes.     History reviewed. No pertinent past medical history.  There are no problems to display for this patient.   Past Surgical History:  Procedure Laterality Date  . BREAST EXCISIONAL BIOPSY Left   . CESAREAN SECTION      Prior to Admission medications   Medication Sig Start Date End Date Taking? Authorizing Provider  amoxicillin (AMOXIL) 875 MG tablet Take 1 tablet (875 mg total) by mouth 2 (two) times daily. 12/02/20  Yes Pia Mau M, PA-C  amoxicillin-clavulanate (AUGMENTIN) 875-125 MG tablet Take 1 tablet by mouth 2 (two) times daily. 04/15/20   Triplett, Rulon Eisenmenger B, FNP  traMADol (ULTRAM) 50 MG tablet Take 1 tablet (50 mg total) by mouth every 6 (six) hours as needed. 12/03/19   Tommi Rumps, PA-C    Allergies Patient has no known allergies.  Family History  Problem Relation Age of Onset  . Breast cancer Neg Hx     Social History Social History   Tobacco Use  . Smoking status: Former Games developer  . Smokeless tobacco: Never Used  Substance Use Topics  . Alcohol use: Yes  . Drug use: No     Review of Systems  Constitutional: No fever/chills Eyes:  No discharge ENT: Patient has dental pain.  Respiratory: no  cough. No SOB/ use of accessory muscles to breath Gastrointestinal:   No nausea, no vomiting.  No diarrhea.  No constipation. Musculoskeletal: Negative for musculoskeletal pain. Skin: Negative for rash, abrasions, lacerations, ecchymosis.   ____________________________________________   PHYSICAL EXAM:  VITAL SIGNS: ED Triage Vitals  Enc Vitals Group     BP 12/02/20 1832 (!) 158/99     Pulse Rate 12/02/20 1832 87     Resp 12/02/20 1832 16     Temp 12/02/20 1832 98.9 F (37.2 C)     Temp Source 12/02/20 1832 Oral     SpO2 12/02/20 1832 96 %     Weight 12/02/20 1830 163 lb 2.3 oz (74 kg)     Height 12/02/20 1830 5\' 4"  (1.626 m)     Head Circumference --      Peak Flow --      Pain Score 12/02/20 1830 7     Pain Loc --      Pain Edu? --      Excl. in GC? --      Constitutional: Alert and oriented. Well appearing and in no acute distress. Eyes: Conjunctivae are normal. PERRL. EOMI. Head: Atraumatic.  ENT:      Ears: TMs are pearly.       Nose: No congestion/rhinnorhea.      Mouth/Throat: Mucous membranes are moist.  Dentition is exceptionally healthy.  Superior 10 is broken. Neck: No stridor.  No cervical  spine tenderness to palpation. Cardiovascular: Normal rate, regular rhythm. Normal S1 and S2.  Good peripheral circulation. Respiratory: Normal respiratory effort without tachypnea or retractions. Lungs CTAB. Good air entry to the bases with no decreased or absent breath sounds Gastrointestinal: Bowel sounds x 4 quadrants. Soft and nontender to palpation. No guarding or rigidity. No distention. Musculoskeletal: Full range of motion to all extremities. No obvious deformities noted Neurologic:  Normal for age. No gross focal neurologic deficits are appreciated.  Skin:  Skin is warm, dry and intact. No rash noted. Psychiatric: Mood and affect are normal for age. Speech and behavior are normal.   ____________________________________________   LABS (all labs ordered are  listed, but only abnormal results are displayed)  Labs Reviewed - No data to display ____________________________________________  EKG   ____________________________________________  RADIOLOGY  No results found.  ____________________________________________    PROCEDURES  Procedure(s) performed:     Procedures     Medications  amoxicillin (AMOXIL) capsule 500 mg (500 mg Oral Given 12/02/20 1952)     ____________________________________________   INITIAL IMPRESSION / ASSESSMENT AND PLAN / ED COURSE  Pertinent labs & imaging results that were available during my care of the patient were reviewed by me and considered in my medical decision making (see chart for details).       Assessment and plan Dental pain 50 year old female presents to the emergency department with a broken superior tendon and left upper jaw swelling.  Patient had mild loss of the left nasolabial fold.  She had no pain underneath the tongue.  Patient declined injection of Toradol for pain stating that Tylenol and ibuprofen were managing her pain at home.  She was started on amoxicillin twice daily for the next 10 days.  She was given her first dose of amoxicillin prior to discharge.    ____________________________________________  FINAL CLINICAL IMPRESSION(S) / ED DIAGNOSES  Final diagnoses:  Pain, dental      NEW MEDICATIONS STARTED DURING THIS VISIT:  ED Discharge Orders         Ordered    amoxicillin (AMOXIL) 875 MG tablet  2 times daily        12/02/20 1945              This chart was dictated using voice recognition software/Dragon. Despite best efforts to proofread, errors can occur which can change the meaning. Any change was purely unintentional.     Orvil Feil, PA-C 12/02/20 1957    Gilles Chiquito, MD 12/03/20 [phone]

## 2021-01-30 ENCOUNTER — Emergency Department: Payer: BLUE CROSS/BLUE SHIELD

## 2021-01-30 ENCOUNTER — Emergency Department
Admission: EM | Admit: 2021-01-30 | Discharge: 2021-01-31 | Disposition: A | Discharge status: Home / Self Care | Payer: BLUE CROSS/BLUE SHIELD | Attending: Emergency Medicine | Admitting: Emergency Medicine

## 2021-01-30 ENCOUNTER — Other Ambulatory Visit: Payer: Self-pay

## 2021-01-30 DIAGNOSIS — S59911A Unspecified injury of right forearm, initial encounter: Secondary | ICD-10-CM | POA: Insufficient documentation

## 2021-01-30 DIAGNOSIS — R9389 Abnormal findings on diagnostic imaging of other specified body structures: Secondary | ICD-10-CM | POA: Diagnosis not present

## 2021-01-30 DIAGNOSIS — R0789 Other chest pain: Secondary | ICD-10-CM | POA: Insufficient documentation

## 2021-01-30 DIAGNOSIS — Z87891 Personal history of nicotine dependence: Secondary | ICD-10-CM | POA: Insufficient documentation

## 2021-01-30 DIAGNOSIS — Y92410 Unspecified street and highway as the place of occurrence of the external cause: Secondary | ICD-10-CM | POA: Diagnosis not present

## 2021-01-30 DIAGNOSIS — H1131 Conjunctival hemorrhage, right eye: Secondary | ICD-10-CM | POA: Insufficient documentation

## 2021-01-30 DIAGNOSIS — S301XXA Contusion of abdominal wall, initial encounter: Secondary | ICD-10-CM | POA: Diagnosis not present

## 2021-01-30 DIAGNOSIS — R519 Headache, unspecified: Secondary | ICD-10-CM | POA: Diagnosis not present

## 2021-01-30 DIAGNOSIS — S6992XA Unspecified injury of left wrist, hand and finger(s), initial encounter: Secondary | ICD-10-CM | POA: Insufficient documentation

## 2021-01-30 DIAGNOSIS — S3991XA Unspecified injury of abdomen, initial encounter: Secondary | ICD-10-CM | POA: Diagnosis present

## 2021-01-30 DIAGNOSIS — H1133 Conjunctival hemorrhage, bilateral: Secondary | ICD-10-CM

## 2021-01-30 DIAGNOSIS — T1490XA Injury, unspecified, initial encounter: Secondary | ICD-10-CM

## 2021-01-30 LAB — CBC WITH DIFFERENTIAL/PLATELET
Abs Immature Granulocytes: 0.07 10*3/uL (ref 0.00–0.07)
Basophils Absolute: 0.1 10*3/uL (ref 0.0–0.1)
Basophils Relative: 0 %
Eosinophils Absolute: 0.1 10*3/uL (ref 0.0–0.5)
Eosinophils Relative: 1 %
HCT: 45.5 % (ref 36.0–46.0)
Hemoglobin: 16.8 g/dL — ABNORMAL HIGH (ref 12.0–15.0)
Immature Granulocytes: 1 %
Lymphocytes Relative: 18 %
Lymphs Abs: 2.6 10*3/uL (ref 0.7–4.0)
MCH: 31.9 pg (ref 26.0–34.0)
MCHC: 36.9 g/dL — ABNORMAL HIGH (ref 30.0–36.0)
MCV: 86.5 fL (ref 80.0–100.0)
Monocytes Absolute: 0.8 10*3/uL (ref 0.1–1.0)
Monocytes Relative: 5 %
Neutro Abs: 11.2 10*3/uL — ABNORMAL HIGH (ref 1.7–7.7)
Neutrophils Relative %: 75 %
Platelets: 286 10*3/uL (ref 150–400)
RBC: 5.26 MIL/uL — ABNORMAL HIGH (ref 3.87–5.11)
RDW: 12.5 % (ref 11.5–15.5)
WBC: 14.8 10*3/uL — ABNORMAL HIGH (ref 4.0–10.5)
nRBC: 0 % (ref 0.0–0.2)

## 2021-01-30 LAB — BASIC METABOLIC PANEL
Anion gap: 11 (ref 5–15)
BUN: 12 mg/dL (ref 6–20)
CO2: 22 mmol/L (ref 22–32)
Calcium: 9.1 mg/dL (ref 8.9–10.3)
Chloride: 103 mmol/L (ref 98–111)
Creatinine, Ser: 0.76 mg/dL (ref 0.44–1.00)
GFR, Estimated: >60 mL/min (ref >60)
Glucose, Bld: 109 mg/dL — ABNORMAL HIGH (ref 70–99)
Potassium: 3.5 mmol/L (ref 3.5–5.1)
Sodium: 136 mmol/L (ref 135–145)

## 2021-01-30 MED ORDER — IOHEXOL 300 MG/ML  SOLN
100.0000 mL | Freq: Once | INTRAMUSCULAR | Status: AC | PRN
  Administered 2021-01-30: 100 mL via INTRAVENOUS
  Filled 2021-01-30: qty 100
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
## 2021-01-30 NOTE — Discharge Instructions (Addendum)
Please follow up with your primary care provider to further evaluate incidental findings from your CT scans.  Return to the ER for symptoms of concern if unable to see primary care.

## 2021-06-04 ENCOUNTER — Other Ambulatory Visit: Payer: Self-pay | Admitting: Internal Medicine

## 2021-06-04 DIAGNOSIS — R918 Other nonspecific abnormal finding of lung field: Secondary | ICD-10-CM

## 2021-06-20 ENCOUNTER — Ambulatory Visit
Admission: RE | Admit: 2021-06-20 | Discharge: 2021-06-20 | Disposition: A | Discharge status: Home / Self Care | Payer: BLUE CROSS/BLUE SHIELD | Source: Ambulatory Visit | Attending: Internal Medicine | Admitting: Internal Medicine

## 2021-06-20 ENCOUNTER — Other Ambulatory Visit: Payer: Self-pay

## 2021-06-20 DIAGNOSIS — R918 Other nonspecific abnormal finding of lung field: Secondary | ICD-10-CM | POA: Diagnosis present

## 2021-08-22 ENCOUNTER — Other Ambulatory Visit: Payer: Self-pay | Admitting: Certified Nurse Midwife

## 2021-08-22 DIAGNOSIS — Z1231 Encounter for screening mammogram for malignant neoplasm of breast: Secondary | ICD-10-CM

## 2021-08-27 ENCOUNTER — Ambulatory Visit
Admission: RE | Admit: 2021-08-27 | Discharge: 2021-08-27 | Disposition: A | Discharge status: Home / Self Care | Payer: BLUE CROSS/BLUE SHIELD | Source: Ambulatory Visit | Attending: Certified Nurse Midwife | Admitting: Certified Nurse Midwife

## 2021-08-27 ENCOUNTER — Other Ambulatory Visit: Payer: Self-pay

## 2021-08-27 DIAGNOSIS — Z1231 Encounter for screening mammogram for malignant neoplasm of breast: Secondary | ICD-10-CM | POA: Diagnosis not present

## 2022-03-09 ENCOUNTER — Emergency Department
Admission: EM | Admit: 2022-03-09 | Discharge: 2022-03-09 | Disposition: A | Discharge status: Home / Self Care | Payer: BLUE CROSS/BLUE SHIELD | Attending: Emergency Medicine | Admitting: Emergency Medicine

## 2022-03-09 ENCOUNTER — Other Ambulatory Visit: Payer: Self-pay

## 2022-03-09 DIAGNOSIS — K0889 Other specified disorders of teeth and supporting structures: Secondary | ICD-10-CM | POA: Diagnosis not present

## 2022-03-09 MED ORDER — AMOXICILLIN 500 MG PO CAPS
500.0000 mg | ORAL_CAPSULE | Freq: Once | ORAL | Status: AC
  Administered 2022-03-09: 500 mg via ORAL
  Filled 2022-03-09: qty 1

## 2022-03-09 MED ORDER — AMOXICILLIN 500 MG PO TABS: 500.0000 mg | ORAL_TABLET | Freq: Three times a day (TID) | ORAL | 0 refills | Status: AC

## 2022-03-09 MED ORDER — IBUPROFEN 400 MG PO TABS
400.0000 mg | ORAL_TABLET | Freq: Once | ORAL | Status: AC
  Administered 2022-03-09: 400 mg via ORAL
  Filled 2022-03-09: qty 1

## 2022-06-22 IMAGING — MG MM DIGITAL SCREENING BILAT W/ TOMO AND CAD
8 series · 8 of 24 positions shown · non-contrast
Comparison: Previous exam(s).

CLINICAL DATA: Screening.

EXAM:
DIGITAL SCREENING BILATERAL MAMMOGRAM WITH TOMOSYNTHESIS AND CAD
TECHNIQUE: Bilateral screening digital craniocaudal and mediolateral oblique
mammograms were obtained. Bilateral screening digital breast
tomosynthesis was performed. The images were evaluated with
computer-aided detection.

[R CC synth-2D]
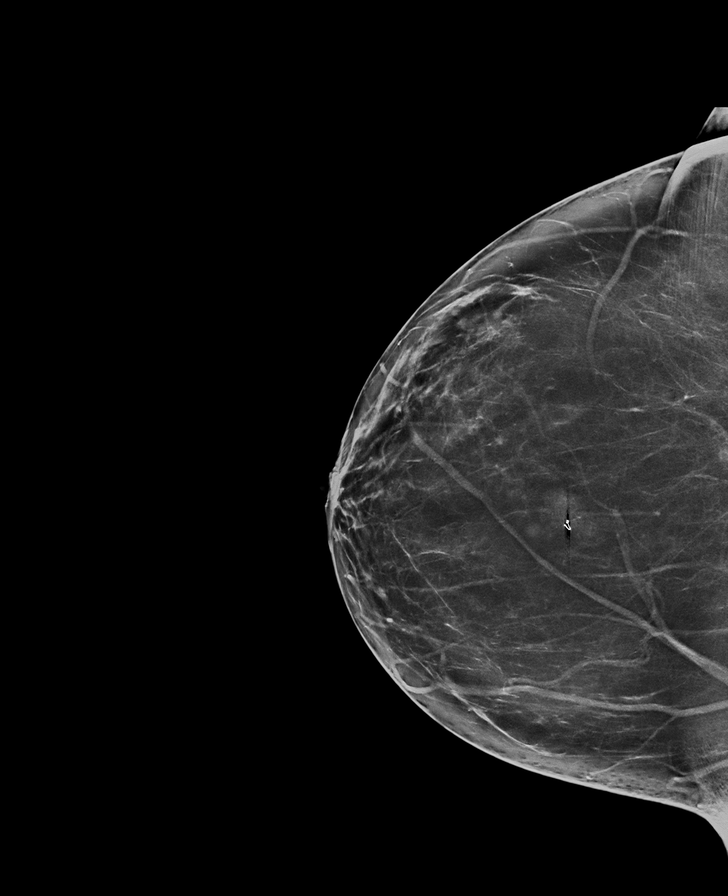

[L MLO synth-2D]
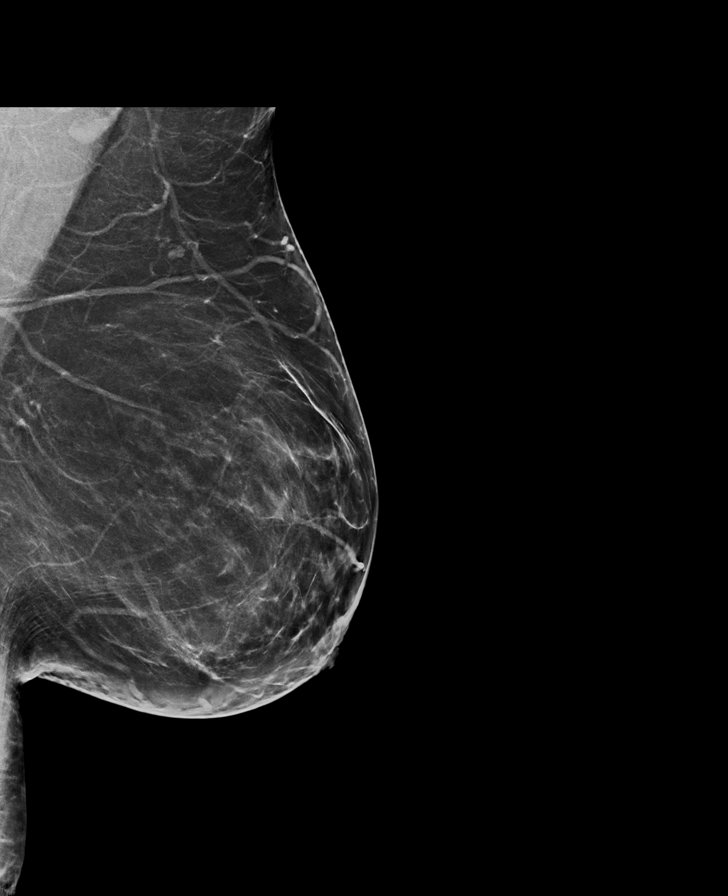

[L CC synth-2D]
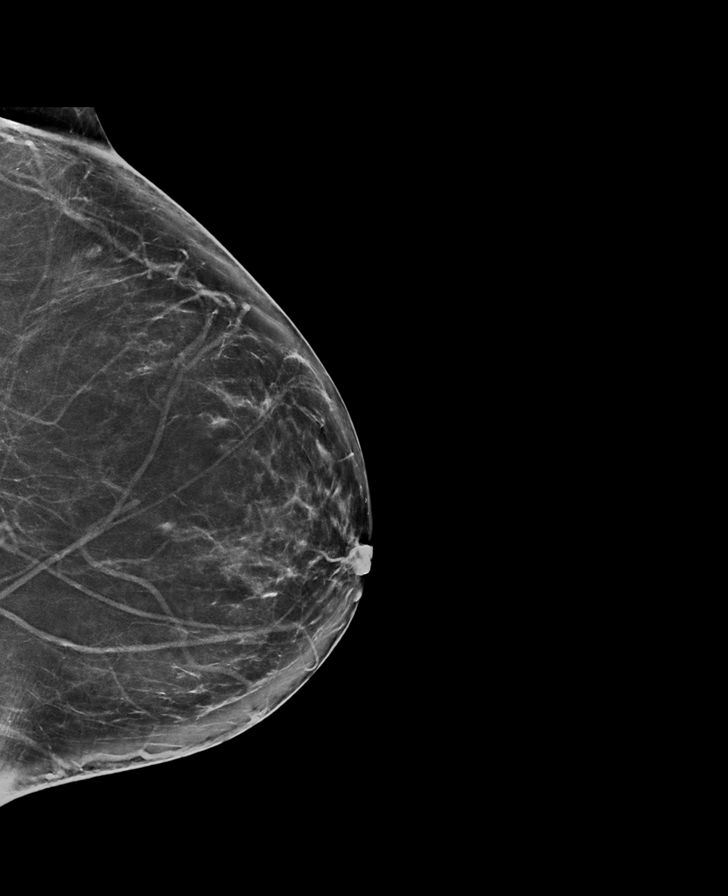

[R MLO synth-2D]
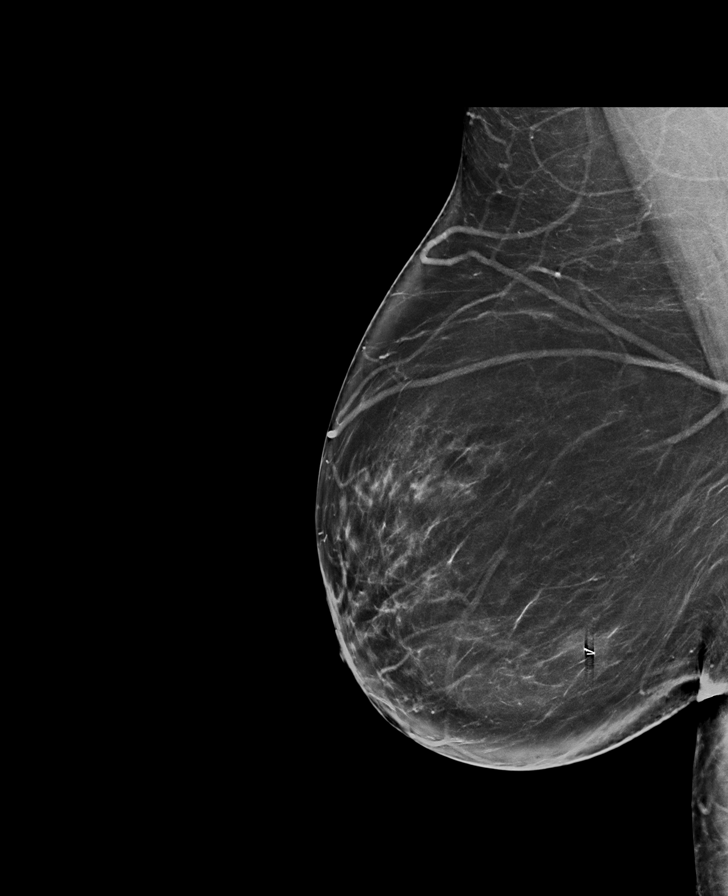

[L MLO tomo · tomo slice 41/82.0]
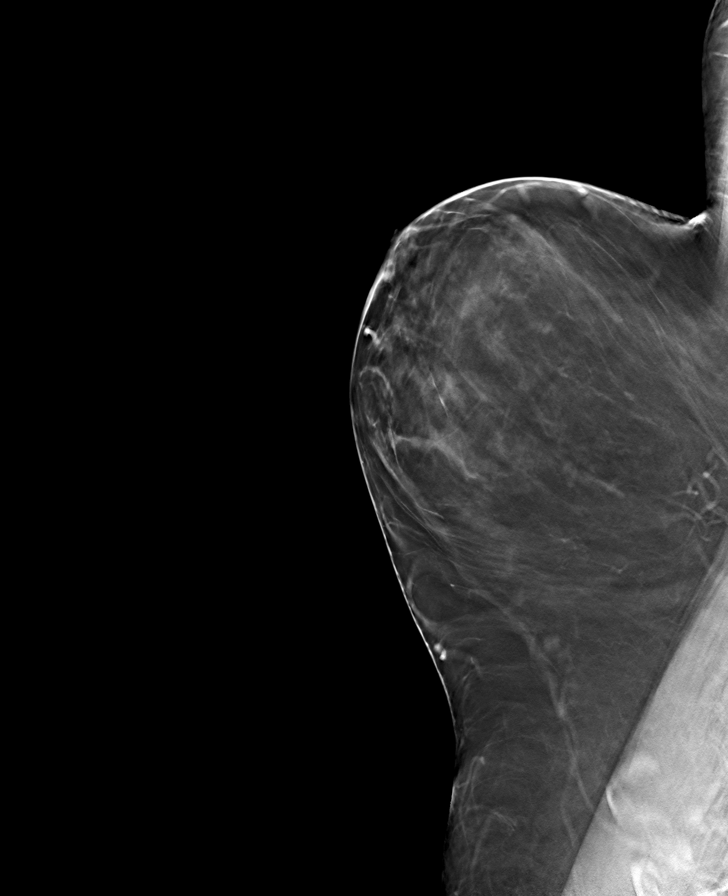

[R CC tomo · tomo slice 39/78.0]
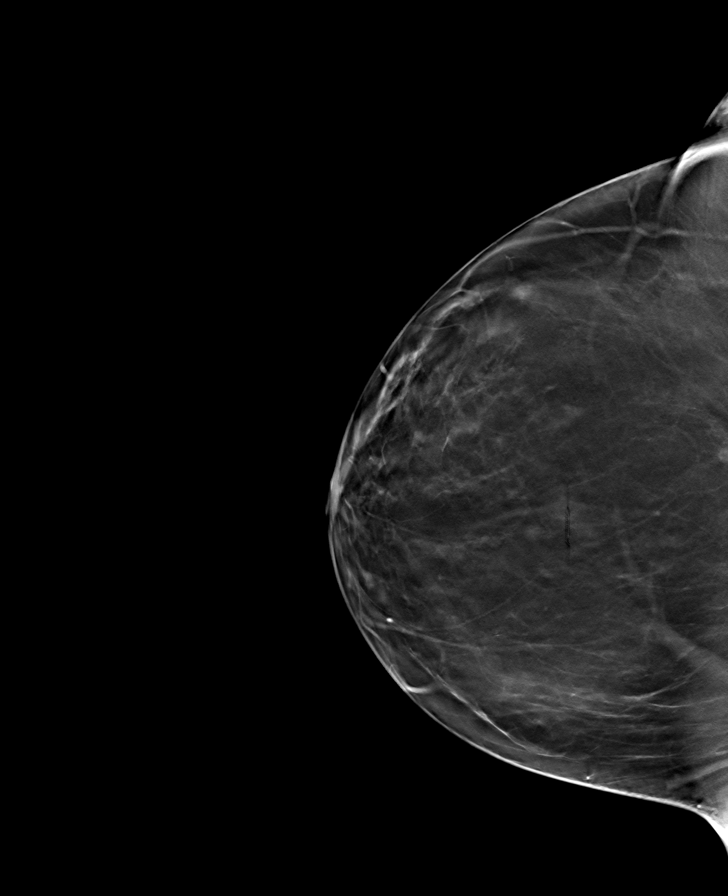

[L CC tomo · tomo slice 39/76.0]
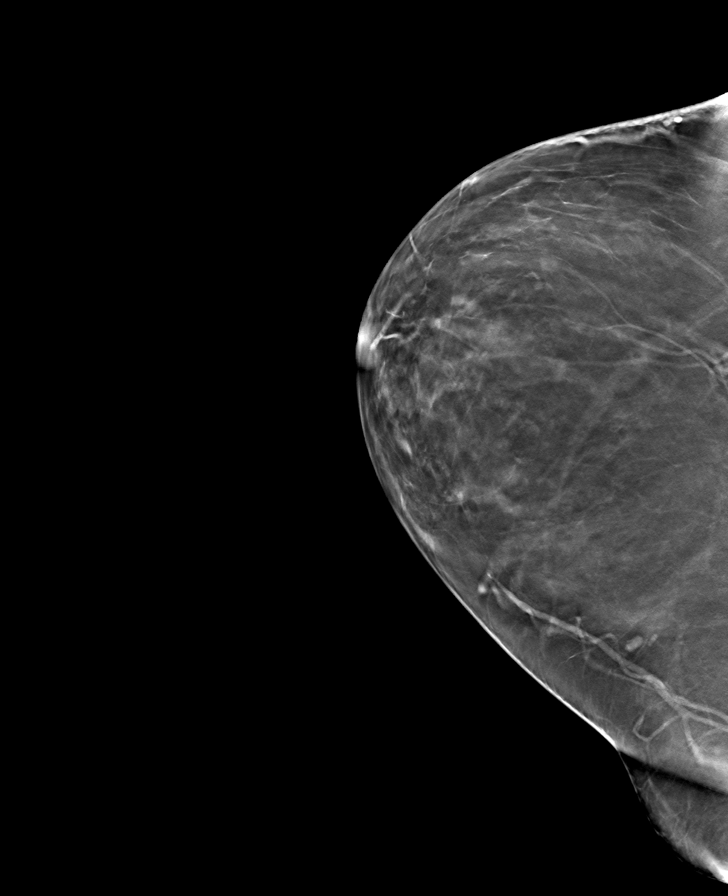

[R MLO tomo · tomo slice 42/83.0]
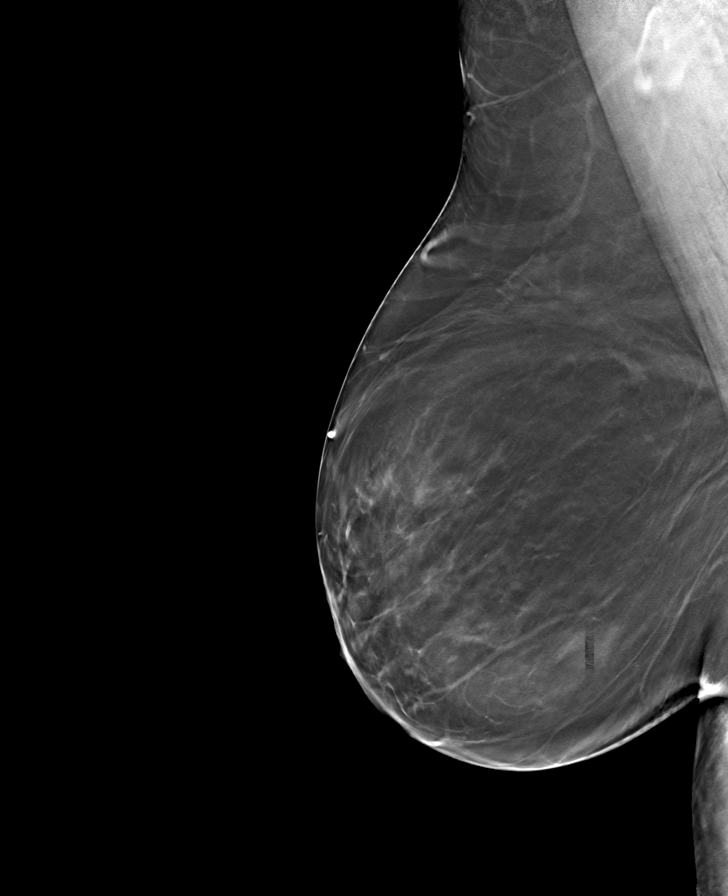

[8 of 24 positions shown; findings below may reference images not displayed]

ACR Breast Density Category b: There are scattered areas of
fibroglandular density.
FINDINGS: There are no findings suspicious for malignancy.
IMPRESSION: No mammographic evidence of malignancy. A result letter of this
screening mammogram will be mailed directly to the patient.

RECOMMENDATION:
Screening mammogram in one year. (Code:51-O-LD2)

BI-RADS CATEGORY  1: Negative.

## 2023-07-25 ENCOUNTER — Other Ambulatory Visit: Payer: Self-pay | Admitting: Certified Nurse Midwife

## 2023-07-25 DIAGNOSIS — Z1231 Encounter for screening mammogram for malignant neoplasm of breast: Secondary | ICD-10-CM

## 2023-12-22 ENCOUNTER — Ambulatory Visit
Admission: RE | Admit: 2023-12-22 | Discharge: 2023-12-22 | Disposition: A | Discharge status: Home / Self Care | Source: Ambulatory Visit | Attending: Certified Nurse Midwife | Admitting: Certified Nurse Midwife

## 2023-12-22 DIAGNOSIS — Z1231 Encounter for screening mammogram for malignant neoplasm of breast: Secondary | ICD-10-CM | POA: Diagnosis present
